# Patient Record
Sex: Male | Born: 1975 | Race: White | Hispanic: No | Marital: Single
Health system: Southern US, Community
[De-identification: ages and names within clinical notes are randomized; demographics above are authoritative.]

---

## 2019-12-18 ENCOUNTER — Emergency Department (HOSPITAL_COMMUNITY): Payer: No Typology Code available for payment source

## 2019-12-18 ENCOUNTER — Emergency Department (HOSPITAL_COMMUNITY)
Admission: EM | Admit: 2019-12-18 | Discharge: 2019-12-18 | Disposition: A | Discharge status: Home / Self Care | Payer: No Typology Code available for payment source | Attending: Emergency Medicine | Admitting: Emergency Medicine

## 2019-12-18 DIAGNOSIS — R519 Headache, unspecified: Secondary | ICD-10-CM | POA: Diagnosis present

## 2019-12-18 DIAGNOSIS — L539 Erythematous condition, unspecified: Secondary | ICD-10-CM | POA: Diagnosis not present

## 2019-12-18 DIAGNOSIS — Z041 Encounter for examination and observation following transport accident: Secondary | ICD-10-CM | POA: Insufficient documentation

## 2019-12-18 LAB — CBC
HCT: 46.1 % (ref 39.0–52.0)
Hemoglobin: 16 g/dL (ref 13.0–17.0)
MCH: 30.5 pg (ref 26.0–34.0)
MCHC: 34.7 g/dL (ref 30.0–36.0)
MCV: 88 fL (ref 80.0–100.0)
Platelets: 253 10*3/uL (ref 150–400)
RBC: 5.24 MIL/uL (ref 4.22–5.81)
RDW: 13.3 % (ref 11.5–15.5)
WBC: 11.1 10*3/uL — ABNORMAL HIGH (ref 4.0–10.5)
nRBC: 0 % (ref 0.0–0.2)

## 2019-12-18 LAB — COMPREHENSIVE METABOLIC PANEL
ALT: 42 U/L (ref 0–44)
AST: 38 U/L (ref 15–41)
Albumin: 4.2 g/dL (ref 3.5–5.0)
Alkaline Phosphatase: 53 U/L (ref 38–126)
Anion gap: 11 (ref 5–15)
BUN: 5 mg/dL — ABNORMAL LOW (ref 6–20)
CO2: 26 mmol/L (ref 22–32)
Calcium: 9.1 mg/dL (ref 8.9–10.3)
Chloride: 106 mmol/L (ref 98–111)
Creatinine, Ser: 1.02 mg/dL (ref 0.61–1.24)
GFR calc Af Amer: >60 mL/min (ref >60)
GFR calc non Af Amer: >60 mL/min (ref >60)
Glucose, Bld: 94 mg/dL (ref 70–99)
Potassium: 4.2 mmol/L (ref 3.5–5.1)
Sodium: 143 mmol/L (ref 135–145)
Total Bilirubin: 0.5 mg/dL (ref 0.3–1.2)
Total Protein: 7.4 g/dL (ref 6.5–8.1)

## 2019-12-18 LAB — LIPASE, BLOOD: Lipase: 38 U/L (ref 11–51)
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
## 2019-12-18 NOTE — ED Notes (Signed)
Discharge instructions reviewed with pt. Pt verbalized understanding. Pt ambulated wit steady gait, refusing wheelchair upon discharge.
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
